# Patient Record
Sex: Female | Born: 1965 | Hispanic: No | Marital: Married | Smoking: Never smoker
Health system: Southern US, Community
[De-identification: ages and names within clinical notes are randomized; demographics above are authoritative.]

## PROBLEM LIST (undated history)

## (undated) DIAGNOSIS — M1990 Unspecified osteoarthritis, unspecified site: Secondary | ICD-10-CM

## (undated) HISTORY — PX: APPENDECTOMY: SHX54

## (undated) HISTORY — PX: FOOT MASS EXCISION: SHX1663

## (undated) HISTORY — PX: DIAGNOSTIC LAPAROSCOPY WITH REMOVAL OF ECTOPIC PREGNANCY: SHX6449

---

## 2004-01-31 ENCOUNTER — Other Ambulatory Visit: Admission: RE | Admit: 2004-01-31 | Discharge: 2004-01-31 | Payer: Self-pay | Admitting: Obstetrics and Gynecology

## 2005-07-01 ENCOUNTER — Other Ambulatory Visit: Admission: RE | Admit: 2005-07-01 | Discharge: 2005-07-01 | Payer: Self-pay | Admitting: Obstetrics and Gynecology

## 2009-06-23 ENCOUNTER — Encounter: Admission: RE | Admit: 2009-06-23 | Discharge: 2009-06-23 | Payer: Self-pay | Admitting: Obstetrics and Gynecology

## 2014-05-27 ENCOUNTER — Other Ambulatory Visit: Payer: Self-pay | Admitting: Obstetrics and Gynecology

## 2014-05-27 DIAGNOSIS — N644 Mastodynia: Secondary | ICD-10-CM

## 2014-05-27 DIAGNOSIS — N6320 Unspecified lump in the left breast, unspecified quadrant: Secondary | ICD-10-CM

## 2014-06-09 ENCOUNTER — Ambulatory Visit
Admission: RE | Admit: 2014-06-09 | Discharge: 2014-06-09 | Disposition: A | Discharge status: Home / Self Care | Payer: BC Managed Care – PPO | Source: Ambulatory Visit | Attending: Obstetrics and Gynecology | Admitting: Obstetrics and Gynecology

## 2014-06-09 ENCOUNTER — Other Ambulatory Visit: Payer: Self-pay | Admitting: Obstetrics and Gynecology

## 2014-06-09 DIAGNOSIS — N6320 Unspecified lump in the left breast, unspecified quadrant: Secondary | ICD-10-CM

## 2014-06-09 DIAGNOSIS — N644 Mastodynia: Secondary | ICD-10-CM

## 2015-03-14 ENCOUNTER — Other Ambulatory Visit (HOSPITAL_BASED_OUTPATIENT_CLINIC_OR_DEPARTMENT_OTHER): Payer: Self-pay | Admitting: Family Medicine

## 2015-03-14 DIAGNOSIS — R51 Headache: Principal | ICD-10-CM

## 2015-03-14 DIAGNOSIS — R519 Headache, unspecified: Secondary | ICD-10-CM

## 2015-03-17 ENCOUNTER — Other Ambulatory Visit (HOSPITAL_BASED_OUTPATIENT_CLINIC_OR_DEPARTMENT_OTHER): Payer: Self-pay | Admitting: Family Medicine

## 2015-03-17 DIAGNOSIS — H539 Unspecified visual disturbance: Secondary | ICD-10-CM

## 2015-03-18 ENCOUNTER — Ambulatory Visit (HOSPITAL_BASED_OUTPATIENT_CLINIC_OR_DEPARTMENT_OTHER)
Admission: RE | Admit: 2015-03-18 | Discharge: 2015-03-18 | Disposition: A | Discharge status: Home / Self Care | Payer: BLUE CROSS/BLUE SHIELD | Source: Ambulatory Visit | Attending: Family Medicine | Admitting: Family Medicine

## 2015-03-18 DIAGNOSIS — H43399 Other vitreous opacities, unspecified eye: Secondary | ICD-10-CM | POA: Insufficient documentation

## 2015-03-18 DIAGNOSIS — H539 Unspecified visual disturbance: Secondary | ICD-10-CM

## 2015-03-18 DIAGNOSIS — R51 Headache: Secondary | ICD-10-CM | POA: Insufficient documentation

## 2015-03-21 ENCOUNTER — Ambulatory Visit (INDEPENDENT_AMBULATORY_CARE_PROVIDER_SITE_OTHER): Payer: BLUE CROSS/BLUE SHIELD

## 2015-03-21 ENCOUNTER — Ambulatory Visit (INDEPENDENT_AMBULATORY_CARE_PROVIDER_SITE_OTHER): Payer: BLUE CROSS/BLUE SHIELD | Admitting: Podiatry

## 2015-03-21 VITALS — BP 116/71 | HR 69 | Resp 16 | Ht 67.0 in | Wt 200.0 lb

## 2015-03-21 DIAGNOSIS — M2041 Other hammer toe(s) (acquired), right foot: Secondary | ICD-10-CM | POA: Diagnosis not present

## 2015-03-21 NOTE — Progress Notes (Signed)
   Subjective:    Patient ID: Shannon Johnston, female    DOB: [birth year], 49 y.o.   MRN: [id number]  HPI: She presents today after having developed some numbness and tingling in her second digit of her right foot. States that 2 years ago she jammed the toe twice within 2 days which left it painful and tender at times. She states that sometimes the numbness extends over to the lateral side of the hallux right.    Review of Systems  All other systems reviewed and are negative.      Objective:   Physical Exam: 49 year old white female in no apparent distress vital signs stable alert and oriented 3. Pulses are strongly palpable. Neurologic sensorium is intact per Semmes-Weinstein monofilament. Deep tendon reflexes are intact bilateral and muscle strength +5 over 5 dorsiflexion and plantar flexors and inverters and everters all intrinsic musculature is intact. Orthopedic evaluation does demonstrate hammertoe deformities 2 through 5 bilaterally right side seems to be worse with more rigid hammertoe deformity at the second PIPJ right foot. She also demonstrates a sagittal bone deformity dorsal aspect of the bilateral foot with some tenderness on palpation of the deep peroneal nerve in this region right greater than left noted deformity appears to be worse on the left side. Radiographic evaluation 3 views bilateral foot today in the office demonstrates hammertoe deformity second right with osteoarthritic changes at the PIPJ. Contracture of the second metatarsophalangeal joint is also noted. Cutaneous evaluation demonstrates reactive hyperkeratosis subsecond metatarsophalangeal joints bilateral.        Assessment & Plan:  Assessment: Hammertoe deformities bilateral. Second digit right foot appears to be more arthritic. She also has some neuritis of the deep peroneal nerve due to several bone deformity bilateral right greater than left.  Plan: Discussed etiology and pathology conservative versus  surgical therapy she will follow-up with me for surgical discussion as her symptoms worsen.  Arbutus Ped DPM

## 2018-05-27 DIAGNOSIS — M19041 Primary osteoarthritis, right hand: Secondary | ICD-10-CM | POA: Insufficient documentation

## 2018-05-27 DIAGNOSIS — IMO0002 Reserved for concepts with insufficient information to code with codable children: Secondary | ICD-10-CM | POA: Insufficient documentation

## 2018-05-27 DIAGNOSIS — R229 Localized swelling, mass and lump, unspecified: Secondary | ICD-10-CM

## 2018-05-27 DIAGNOSIS — M6740 Ganglion, unspecified site: Secondary | ICD-10-CM | POA: Insufficient documentation

## 2018-06-02 ENCOUNTER — Other Ambulatory Visit: Payer: Self-pay | Admitting: Orthopedic Surgery

## 2018-06-16 ENCOUNTER — Other Ambulatory Visit: Payer: Self-pay | Admitting: Podiatry

## 2018-06-16 ENCOUNTER — Encounter: Payer: Self-pay | Admitting: Podiatry

## 2018-06-16 ENCOUNTER — Telehealth: Payer: Self-pay | Admitting: Podiatry

## 2018-06-16 ENCOUNTER — Ambulatory Visit (INDEPENDENT_AMBULATORY_CARE_PROVIDER_SITE_OTHER): Payer: BLUE CROSS/BLUE SHIELD

## 2018-06-16 ENCOUNTER — Ambulatory Visit (INDEPENDENT_AMBULATORY_CARE_PROVIDER_SITE_OTHER): Payer: BLUE CROSS/BLUE SHIELD | Admitting: Podiatry

## 2018-06-16 DIAGNOSIS — M2041 Other hammer toe(s) (acquired), right foot: Secondary | ICD-10-CM

## 2018-06-16 DIAGNOSIS — M779 Enthesopathy, unspecified: Secondary | ICD-10-CM

## 2018-06-16 DIAGNOSIS — M778 Other enthesopathies, not elsewhere classified: Secondary | ICD-10-CM

## 2018-06-16 DIAGNOSIS — G5762 Lesion of plantar nerve, left lower limb: Secondary | ICD-10-CM

## 2018-06-16 DIAGNOSIS — N951 Menopausal and female climacteric states: Secondary | ICD-10-CM | POA: Insufficient documentation

## 2018-06-16 DIAGNOSIS — N63 Unspecified lump in unspecified breast: Secondary | ICD-10-CM | POA: Insufficient documentation

## 2018-06-16 NOTE — Telephone Encounter (Signed)
I was given a boot this morning and I think its for the left foot. If somebody could call me back and let me know at [phone].

## 2018-06-16 NOTE — Telephone Encounter (Signed)
I called pt and informed that the boot could be used for either foot.

## 2018-06-16 NOTE — Progress Notes (Signed)
She presents today chief complaint of pain to the second metatarsal phalangeal joint of the right foot as well as pain to the second metatarsophalangeal joint of the left foot.  She states that she would like to have the right foot surgically corrected because becoming more more painful as time goes on and is just exquisitely painful with shoe gear.  Objective: Vital signs are stable she is alert and oriented x3 I reviewed her past medical history medications allergy surgeries and social history.  Radiographs taken today demonstrates osteoarthritic change of the PIPJ second digit of the right foot with corrective hammertoe deformity second metatarsophalangeal joint and third metatarsal phalangeal joint which appears to be more flexible in nature right foot.  Left foot demonstrates painful elongated plantarflexed second metatarsal with mild hammertoe deformity.  None reducible hammertoe deformity second PIPJ right foot.  Reducible metatarsal phalangeal joint second and third.  Right foot painful on palpation and range of motion of the second metatarsal phalangeal joint.  Assessment: Capsulitis second metatarsal phalangeal joint left foot.  Hammertoe deformity with plantarflexed second and third metatarsal second and third toes right foot.  Plan: Discussed etiology pathology and surgical therapies this point time went ahead and injected the left second metatarsal phalangeal joint today with 5 mg of Kenalog 5 mg Marcaine point maximal tenderness left foot.  Right foot we consented her today for surgical intervention consisting of second metatarsal osteotomy hammertoe repair with screws #2 #3 of the right foot.  We discussed pros and cons of the surgery as well as possible complications which may include but are not limited to postop pain bleeding swelling flexion recurrence need further surgery overcorrection under correction digit loss of digit loss of limb loss of life.  We dispensed both oral and written  home-going instructions for her today regarding the day of surgery.  Also provided her with her Cam walker today.  Follow-up with her in 1 month or thereabouts for surgery.

## 2018-06-16 NOTE — Patient Instructions (Signed)
Pre-Operative Instructions  Congratulations, you have decided to take an important step towards improving your quality of life.  You can be assured that the doctors and staff at [facility] will be with you every step of the way.  Here are some important things you should know:  1. Plan to be at the surgery center/hospital at least 1 (one) hour prior to your scheduled time, unless otherwise directed by the surgical center/hospital staff.  You must have a responsible adult accompany you, remain during the surgery and drive you home.  Make sure you have directions to the surgical center/hospital to ensure you arrive on time. 2. If you are having surgery at [facility] or  hospitals, you will need a copy of your medical history and physical form from your family physician within one month prior to the date of surgery. We will give you a form for your primary physician to complete.  3. We make every effort to accommodate the date you request for surgery.  However, there are times where surgery dates or times have to be moved.  We will contact you as soon as possible if a change in schedule is required.   4. No aspirin/ibuprofen for one week before surgery.  If you are on aspirin, any non-steroidal anti-inflammatory medications (Mobic, Aleve, Ibuprofen) should not be taken seven (7) days prior to your surgery.  You make take Tylenol for pain prior to surgery.  5. Medications - If you are taking daily heart and blood pressure medications, seizure, reflux, allergy, asthma, anxiety, pain or diabetes medications, make sure you notify the surgery center/hospital before the day of surgery so they can tell you which medications you should take or avoid the day of surgery. 6. No food or drink after midnight the night before surgery unless directed otherwise by surgical center/hospital staff. 7. No alcoholic beverages 24-hours prior to surgery.  No smoking 24-hours prior or 24-hours after  surgery. 8. Wear loose pants or shorts. They should be loose enough to fit over bandages, boots, and casts. 9. Don't wear slip-on shoes. Sneakers are preferred. 10. Bring your boot with you to the surgery center/hospital.  Also bring crutches or a walker if your physician has prescribed it for you.  If you do not have this equipment, it will be provided for you after surgery. 11. If you have not been contacted by the surgery center/hospital by the day before your surgery, call to confirm the date and time of your surgery. 12. Leave-time from work may vary depending on the type of surgery you have.  Appropriate arrangements should be made prior to surgery with your employer. 13. Prescriptions will be provided immediately following surgery by your doctor.  Fill these as soon as possible after surgery and take the medication as directed. Pain medications will not be refilled on weekends and must be approved by the doctor. 14. Remove nail polish on the operative foot and avoid getting pedicures prior to surgery. 15. Wash the night before surgery.  The night before surgery wash the foot and leg well with water and the antibacterial soap provided. Be sure to pay special attention to beneath the toenails and in between the toes.  Wash for at least three (3) minutes. Rinse thoroughly with water and dry well with a towel.  Perform this wash unless told not to do so by your physician.  Enclosed: 1 Ice pack (please put in freezer the night before surgery)   1 Hibiclens skin cleaner     Pre-op instructions  If you have any questions regarding the instructions, please do not hesitate to call our office.  [place]: [address], [place], [place] [address] -- [phone]  [place]: [address] [place], [place] [address] -- [phone]  [place]: [address]  [place], [place] [address] -- [phone]  [place]: [address], [address], [place], [place] [address] -- [phone]  Website:  [email or web address] 

## 2018-06-19 ENCOUNTER — Telehealth: Payer: Self-pay | Admitting: *Deleted

## 2018-06-19 NOTE — Telephone Encounter (Signed)
"  I'd like to schedule my surgery with Dr. Al Corpus."  Dr. Al Corpus does surgeries on Fridays.  Do you have a date in mind that you would like?  "Yes, I'd like to do it the last week of February."  Dr. Al Corpus is not doing surgeries on February 28.  He can do it on February 21.  "Put me down for February 21.  Is there anything else I need to do?"  You need to register with the surgical center, instructions on how to do that is in the brochure that we gave you.  Someone from the surgical center will call you a day or two prior to your surgical date.  They will give you your arrival time.

## 2018-06-24 ENCOUNTER — Telehealth: Payer: Self-pay | Admitting: *Deleted

## 2018-06-24 ENCOUNTER — Other Ambulatory Visit: Payer: Self-pay

## 2018-06-24 ENCOUNTER — Encounter (HOSPITAL_BASED_OUTPATIENT_CLINIC_OR_DEPARTMENT_OTHER): Payer: Self-pay | Admitting: *Deleted

## 2018-06-24 NOTE — Telephone Encounter (Signed)
"  I'm calling to cancel my surgery for February 2l.  Is it too soon to schedule for surgery the first week of November?  The reason I'm asking is because my insurance just started over and I have a high deductible.  By November, I should have met my deductible."  I will get it rescheduled.  Dr. Milinda Pointer can do it on November 6.  "Okay, what time?"  Someone from the surgical center will call you a day or two prior to your surgery date and they will give you your arrival time.  You will need to see Dr. Milinda Pointer prior to that date for another consult to sign consent forms again.  I can transfer you to a scheduler so you can make that appointment if you like.  "Yes, that would be great."  I transferred the call to [place].  I changed the date in the [facility] [employer] Portal.

## 2018-07-02 ENCOUNTER — Ambulatory Visit (HOSPITAL_BASED_OUTPATIENT_CLINIC_OR_DEPARTMENT_OTHER)
Admission: RE | Admit: 2018-07-02 | Discharge: 2018-07-02 | Disposition: A | Discharge status: Home / Self Care | Payer: BLUE CROSS/BLUE SHIELD | Source: Ambulatory Visit | Attending: Orthopedic Surgery | Admitting: Orthopedic Surgery

## 2018-07-02 ENCOUNTER — Encounter (HOSPITAL_BASED_OUTPATIENT_CLINIC_OR_DEPARTMENT_OTHER): Payer: Self-pay

## 2018-07-02 ENCOUNTER — Encounter (HOSPITAL_BASED_OUTPATIENT_CLINIC_OR_DEPARTMENT_OTHER)
Admission: RE | Disposition: A | Discharge status: Home / Self Care | Payer: Self-pay | Source: Ambulatory Visit | Attending: Orthopedic Surgery

## 2018-07-02 ENCOUNTER — Ambulatory Visit (HOSPITAL_BASED_OUTPATIENT_CLINIC_OR_DEPARTMENT_OTHER): Payer: BLUE CROSS/BLUE SHIELD | Admitting: Certified Registered"

## 2018-07-02 ENCOUNTER — Other Ambulatory Visit: Payer: Self-pay

## 2018-07-02 DIAGNOSIS — M19041 Primary osteoarthritis, right hand: Secondary | ICD-10-CM | POA: Insufficient documentation

## 2018-07-02 DIAGNOSIS — Z8261 Family history of arthritis: Secondary | ICD-10-CM | POA: Diagnosis not present

## 2018-07-02 DIAGNOSIS — M71341 Other bursal cyst, right hand: Secondary | ICD-10-CM | POA: Diagnosis not present

## 2018-07-02 DIAGNOSIS — M67441 Ganglion, right hand: Secondary | ICD-10-CM | POA: Diagnosis present

## 2018-07-02 DIAGNOSIS — M25741 Osteophyte, right hand: Secondary | ICD-10-CM | POA: Insufficient documentation

## 2018-07-02 HISTORY — DX: Unspecified osteoarthritis, unspecified site: M19.90

## 2018-07-02 HISTORY — PX: MASS EXCISION: SHX2000

## 2018-07-02 SURGERY — EXCISION MASS
Anesthesia: Regional | Site: Hand | Laterality: Right

## 2018-07-02 MED ORDER — BUPIVACAINE HCL (PF) 0.25 % IJ SOLN
INTRAMUSCULAR | Status: DC | PRN
  Administered 2018-07-02: 7 mL

## 2018-07-02 MED ORDER — CEFAZOLIN SODIUM-DEXTROSE 2-4 GM/100ML-% IV SOLN
INTRAVENOUS | Status: AC
  Filled 2018-07-02: qty 100

## 2018-07-02 MED ORDER — HYDROMORPHONE HCL 1 MG/ML IJ SOLN: 0.2500 mg | INTRAMUSCULAR | Status: DC | PRN

## 2018-07-02 MED ORDER — MIDAZOLAM HCL 2 MG/2ML IJ SOLN
1.0000 mg | INTRAMUSCULAR | Status: DC | PRN
  Administered 2018-07-02: 2 mg via INTRAVENOUS

## 2018-07-02 MED ORDER — TRAMADOL HCL 50 MG PO TABS: 50.0000 mg | ORAL_TABLET | Freq: Four times a day (QID) | ORAL | 0 refills | Status: AC | PRN

## 2018-07-02 MED ORDER — CHLORHEXIDINE GLUCONATE 4 % EX LIQD: 60.0000 mL | Freq: Once | CUTANEOUS | Status: DC

## 2018-07-02 MED ORDER — CEFAZOLIN SODIUM-DEXTROSE 2-4 GM/100ML-% IV SOLN
2.0000 g | INTRAVENOUS | Status: AC
  Administered 2018-07-02: 2 g via INTRAVENOUS

## 2018-07-02 MED ORDER — PROMETHAZINE HCL 25 MG/ML IJ SOLN: 6.2500 mg | INTRAMUSCULAR | Status: DC | PRN

## 2018-07-02 MED ORDER — OXYCODONE HCL 5 MG/5ML PO SOLN: 5.0000 mg | Freq: Once | ORAL | Status: DC | PRN

## 2018-07-02 MED ORDER — OXYCODONE HCL 5 MG PO TABS: 5.0000 mg | ORAL_TABLET | Freq: Once | ORAL | Status: DC | PRN

## 2018-07-02 MED ORDER — FENTANYL CITRATE (PF) 100 MCG/2ML IJ SOLN
INTRAMUSCULAR | Status: AC
  Filled 2018-07-02: qty 2

## 2018-07-02 MED ORDER — PROPOFOL 500 MG/50ML IV EMUL
INTRAVENOUS | Status: DC | PRN
  Administered 2018-07-02: 50 ug/kg/min via INTRAVENOUS

## 2018-07-02 MED ORDER — SCOPOLAMINE 1 MG/3DAYS TD PT72: 1.0000 | MEDICATED_PATCH | Freq: Once | TRANSDERMAL | Status: DC | PRN

## 2018-07-02 MED ORDER — MIDAZOLAM HCL 2 MG/2ML IJ SOLN
INTRAMUSCULAR | Status: AC
  Filled 2018-07-02: qty 2

## 2018-07-02 MED ORDER — ONDANSETRON HCL 4 MG/2ML IJ SOLN
INTRAMUSCULAR | Status: DC | PRN
  Administered 2018-07-02: 4 mg via INTRAVENOUS

## 2018-07-02 MED ORDER — LACTATED RINGERS IV SOLN
INTRAVENOUS | Status: DC
  Administered 2018-07-02: 10:00:00 via INTRAVENOUS

## 2018-07-02 MED ORDER — FENTANYL CITRATE (PF) 100 MCG/2ML IJ SOLN
50.0000 ug | INTRAMUSCULAR | Status: DC | PRN
  Administered 2018-07-02: 50 ug via INTRAVENOUS

## 2018-07-02 MED ORDER — LIDOCAINE HCL (CARDIAC) PF 100 MG/5ML IV SOSY
PREFILLED_SYRINGE | INTRAVENOUS | Status: DC | PRN
  Administered 2018-07-02: 30 mg via INTRAVENOUS

## 2018-07-02 SURGICAL SUPPLY — 44 items
BANDAGE COBAN STERILE 2 (GAUZE/BANDAGES/DRESSINGS) IMPLANT
BLADE SURG 15 STRL LF DISP TIS (BLADE) ×1 IMPLANT
BLADE SURG 15 STRL SS (BLADE) ×1
BNDG COHESIVE 1X5 TAN STRL LF (GAUZE/BANDAGES/DRESSINGS) ×2 IMPLANT
BNDG COHESIVE 3X5 TAN STRL LF (GAUZE/BANDAGES/DRESSINGS) IMPLANT
BNDG ESMARK 4X9 LF (GAUZE/BANDAGES/DRESSINGS) IMPLANT
BNDG GAUZE ELAST 4 BULKY (GAUZE/BANDAGES/DRESSINGS) IMPLANT
CHLORAPREP W/TINT 26ML (MISCELLANEOUS) ×2 IMPLANT
CORD BIPOLAR FORCEPS 12FT (ELECTRODE) ×2 IMPLANT
COVER BACK TABLE 60X90IN (DRAPES) ×2 IMPLANT
COVER MAYO STAND STRL (DRAPES) ×2 IMPLANT
COVER WAND RF STERILE (DRAPES) IMPLANT
CUFF TOURNIQUET SINGLE 18IN (TOURNIQUET CUFF) IMPLANT
DECANTER SPIKE VIAL GLASS SM (MISCELLANEOUS) IMPLANT
DRAIN PENROSE 1/2X12 LTX STRL (WOUND CARE) IMPLANT
DRAPE EXTREMITY T 121X128X90 (DISPOSABLE) ×2 IMPLANT
DRAPE SURG 17X23 STRL (DRAPES) ×2 IMPLANT
GAUZE SPONGE 4X4 12PLY STRL (GAUZE/BANDAGES/DRESSINGS) ×2 IMPLANT
GAUZE XEROFORM 1X8 LF (GAUZE/BANDAGES/DRESSINGS) ×2 IMPLANT
GLOVE BIO SURGEON STRL SZ 6.5 (GLOVE) ×4 IMPLANT
GLOVE BIOGEL PI IND STRL 7.0 (GLOVE) ×2 IMPLANT
GLOVE BIOGEL PI IND STRL 8.5 (GLOVE) ×1 IMPLANT
GLOVE BIOGEL PI INDICATOR 7.0 (GLOVE) ×2
GLOVE BIOGEL PI INDICATOR 8.5 (GLOVE) ×1
GLOVE SURG ORTHO 8.0 STRL STRW (GLOVE) ×2 IMPLANT
GOWN STRL REUS W/ TWL LRG LVL3 (GOWN DISPOSABLE) ×1 IMPLANT
GOWN STRL REUS W/TWL LRG LVL3 (GOWN DISPOSABLE) ×1
GOWN STRL REUS W/TWL XL LVL3 (GOWN DISPOSABLE) ×2 IMPLANT
NDL SAFETY ECLIPSE 18X1.5 (NEEDLE) IMPLANT
NEEDLE HYPO 18GX1.5 SHARP (NEEDLE)
NEEDLE PRECISIONGLIDE 27X1.5 (NEEDLE) ×2 IMPLANT
NS IRRIG 1000ML POUR BTL (IV SOLUTION) ×2 IMPLANT
PACK BASIN DAY SURGERY FS (CUSTOM PROCEDURE TRAY) ×2 IMPLANT
PAD CAST 3X4 CTTN HI CHSV (CAST SUPPLIES) IMPLANT
PADDING CAST COTTON 3X4 STRL (CAST SUPPLIES)
SPLINT PLASTER CAST XFAST 3X15 (CAST SUPPLIES) IMPLANT
SPLINT PLASTER XTRA FASTSET 3X (CAST SUPPLIES)
STOCKINETTE 4X48 STRL (DRAPES) ×2 IMPLANT
SUT ETHILON 4 0 PS 2 18 (SUTURE) ×2 IMPLANT
SUT VIC AB 4-0 P2 18 (SUTURE) IMPLANT
SYR BULB 3OZ (MISCELLANEOUS) ×2 IMPLANT
SYR CONTROL 10ML LL (SYRINGE) ×2 IMPLANT
TOWEL GREEN STERILE FF (TOWEL DISPOSABLE) ×2 IMPLANT
UNDERPAD 30X30 (UNDERPADS AND DIAPERS) ×2 IMPLANT

## 2018-07-02 NOTE — Discharge Instructions (Addendum)

## 2018-07-02 NOTE — Anesthesia Procedure Notes (Signed)
Anesthesia Regional Block: Bier block (IV Regional)   Pre-Anesthetic Checklist: ,, timeout performed, Correct Patient, Correct Site, Correct Laterality, Correct Procedure,, site marked, surgical consent,, at surgeon's request  Laterality: Right     Needles:  Injection technique: Single-shot  Needle Type: Other      Needle Gauge: 20     Additional Needles:   Procedures:,,,,, intact distal pulses, Esmarch exsanguination, single tourniquet utilized,  Narrative:   Performed by: Personally       

## 2018-07-02 NOTE — Anesthesia Procedure Notes (Signed)
Procedure Name: MAC Date/Time: 07/02/2018 10:54 AM Performed by: Signe Colt, CRNA Pre-anesthesia Checklist: Patient identified, Emergency Drugs available, Suction available, Patient being monitored and Timeout performed Patient Re-evaluated:Patient Re-evaluated prior to induction Oxygen Delivery Method: Simple face mask

## 2018-07-02 NOTE — H&P (Signed)
  Shannon Johnston is an 53 y.o. female.   Chief Complaint: mass right thumb WIO:XBDZ is a 53 year old right-hand-dominant female referred by Dr. Abigail Miyamoto for consultation regarding a mass on the IP joint of her right thumb. She states is been present for 2 months. She recalls no history of injury. Because occasional aching pain going up her thumb with a VAS score 5/10. She has not had any treatment nor tried anything for this. She has a history of arthritis no history of diabetes thyroid problems or gout. Family history is positive for arthritis diabetes negative for thyroid problems and gout. She has been tested for diabetes. She states there is no particular activity that seems to make it better or worse. She states that it is enlarged and got smaller.   Past Medical History:  Diagnosis Date  . Arthritis       No family history on file. Social History:  reports that she has never smoked. She has never used smokeless tobacco. She reports current alcohol use. She reports that she does not use drugs.  Allergies: No Known Allergies  No medications prior to admission.    No results found for this or any previous visit (from the past 48 hour(s)).  No results found.   Pertinent items are noted in HPI.  Height 5\' 7"  (1.702 m), weight 100.7 kg.  General appearance: alert, cooperative and appears stated age Head: Normocephalic, without obvious abnormality Neck: no JVD Resp: clear to auscultation bilaterally Cardio: regular rate and rhythm, S1, S2 normal, no murmur, click, rub or gallop GI: soft, non-tender; bowel sounds normal; no masses,  no organomegaly Extremities: mass right thumb Pulses: 2+ and symmetric Skin: Skin color, texture, turgor normal. No rashes or lesions Neurologic: Grossly normal Incision/Wound: na  Assessment/Plan Assessment:   Osteoarthritis of finger of right hand   Mucoid cyst, joint    Plan: Have discussed the etiology of the problem with her. She would  like to have this removed. Pre-peri-and postoperative course been discussed along with risks and complications. She is aware that there is no guarantee to the surgery the possibility of infection recurrence injury to arteries nerves tendons complete relief symptoms disc possibility of recurrence. She would like to proceed is scheduled for excision mucoid cyst debridement inner phalangeal joint right thumb as an outpatient under regional anesthesia.    Shannon Johnston 07/02/2018, 9:46 AM

## 2018-07-02 NOTE — Transfer of Care (Signed)
Immediate Anesthesia Transfer of Care Note  Patient: Shannon Johnston  Procedure(s) Performed: EXCISION CYST RIGHT THUMB, DEBRIDEMENT INTERPHALANGEAL RIGHT THUMB (Right Hand)  Patient Location: PACU  Anesthesia Type:Bier block  Level of Consciousness: awake, alert , oriented and patient cooperative  Airway & Oxygen Therapy: Patient Spontanous Breathing and Patient connected to face mask oxygen  Post-op Assessment: Report given to RN and Post -op Vital signs reviewed and stable  Post vital signs: Reviewed and stable  Last Vitals:  Vitals Value Taken Time  BP    Temp    Pulse    Resp    SpO2      Last Pain:  Vitals:   07/02/18 1016  TempSrc: Oral  PainSc:          Complications: No apparent anesthesia complications

## 2018-07-02 NOTE — Op Note (Signed)
NAME: Shannon Johnston MEDICAL RECORD NO: [id number] DATE OF BIRTH: 05-08-1966 FACILITY: [facility] LOCATION: [facility] PHYSICIAN: Nicki Reaper, MD   OPERATIVE REPORT   DATE OF PROCEDURE: 07/02/18    PREOPERATIVE DIAGNOSIS:   Mucoid tumor IP joint right thumb   POSTOPERATIVE DIAGNOSIS:   Same   PROCEDURE:  Surgery mucoid cyst with debridement interphalangeal joint osteophytes proximal phalanx right thumb   SURGEON: Cindee Salt, M.D.   ASSISTANT: none   ANESTHESIA:  Bier block with sedation and Local   INTRAVENOUS FLUIDS:  Per anesthesia flow sheet.   ESTIMATED BLOOD LOSS:  Minimal.   COMPLICATIONS:  None.   SPECIMENS:   Cyst synovial tissue osteophyte   TOURNIQUET TIME:    Total Tourniquet Time Documented: Forearm (Right) - 21 minutes Total: Forearm (Right) - 21 minutes    DISPOSITION:  Stable to PACU.   INDICATIONS: Patient is a 53 year old female with a large mass on the dorsal aspect of the IP joint of her right thumb.  This transilluminates.  She is desirous having this excised with debridement of the joint showing degenerative arthritis on x-ray.  Pre-peri-and postoperative course been discussed along with risk complications.  She is aware that there is no guarantee to the surgery the possibility of infection recurrence injury to arteries nerves tendons complete relief symptoms and dystrophy.  In the preoperative area the patient is seen extremity marked by both patient and surgeon antibiotic given  OPERATIVE COURSE: Patient is brought to the operating room where a forearm-based IV regional anesthetic was carried out without difficulty.  She was prepped and draped supine position with the right arm free.  Prep was done with ChloraPrep and a three-minute dry time was allowed timeout taken confirming patient procedure.  A metacarpal block was given quarter percent bupivacaine without epinephrine 7 cc was used.  A curvilinear incision was made over the inner  phalangeal joint the mass of the right thumb carried down through subcutaneous tissue.  The mass was immediately encountered.  With blunt sharp dissection this was dissected free and sent to pathology.  The joint was opened lateral to the extensor tendon.  Moderate swelling of the joint was present.  A hemostatic rondure and house curette were used to remove the osteophytes from the proximal phalanx and a synovectomy performed.  The specimen was sent to pathology.  The wound was copiously irrigated with saline.  The skin was closed and opted for nylon sutures.  Sterile compressive dressing and splint to the finger was applied.  Inflation of the tourniquet all fingers immediately pink.  She was taken to the recovery room for observation in satisfactory condition.  She will be discharged home to return to the hand center [facility] on Tylenol ibuprofen for pain with Ultram as a backup.   Cindee Salt, MD Electronically signed, 07/02/18

## 2018-07-02 NOTE — Brief Op Note (Signed)
07/02/2018  11:13 AM  PATIENT:  Shannon Johnston  53 y.o. female  PRE-OPERATIVE DIAGNOSIS:  MUCOID CYST RIGHT THUMB, DEGENERATIVE JOINT DISEASE INTERPHALANGEAL JOINT RIGHT THUMB  POST-OPERATIVE DIAGNOSIS:  MUCOID CYST RIGHT THUMB, DEGENERATIVE JOINT   PROCEDURE:  Procedure(s): EXCISION CYST RIGHT THUMB, DEBRIDEMENT INTERPHALANGEAL RIGHT THUMB (Right)  SURGEON:  Surgeon(s) and Role:    * Cindee Salt, MD - Primary  PHYSICIAN ASSISTANT:   ASSISTANTS: none   ANESTHESIA:   local, regional and IV sedation  EBL:  16ml  BLOOD ADMINISTERED:none  DRAINS: none   LOCAL MEDICATIONS USED:  BUPIVICAINE   SPECIMEN:  Excision  DISPOSITION OF SPECIMEN:  PATHOLOGY  COUNTS:  YES  TOURNIQUET:   Total Tourniquet Time Documented: Forearm (Right) - 21 minutes Total: Forearm (Right) - 21 minutes   DICTATION: .Dragon Dictation  PLAN OF CARE: Discharge to home after PACU  PATIENT DISPOSITION:  PACU - hemodynamically stable.

## 2018-07-02 NOTE — Anesthesia Postprocedure Evaluation (Signed)
Anesthesia Post Note  Patient: Shannon Johnston  Procedure(s) Performed: EXCISION CYST RIGHT THUMB, DEBRIDEMENT INTERPHALANGEAL RIGHT THUMB (Right Hand)     Patient location during evaluation: PACU Anesthesia Type: Bier Block Level of consciousness: awake and alert Pain management: pain level controlled Vital Signs Assessment: post-procedure vital signs reviewed and stable Respiratory status: spontaneous breathing, nonlabored ventilation and respiratory function stable Cardiovascular status: blood pressure returned to baseline and stable Postop Assessment: no apparent nausea or vomiting Anesthetic complications: no    Last Vitals:  Vitals:   07/02/18 1130 07/02/18 1215  BP: 136/89 120/62  Pulse: (!) 58 (!) 52  Resp: 15   Temp:  36.4 C  SpO2: 98% 100%    Last Pain:  Vitals:   07/02/18 1215  TempSrc: Oral  PainSc: 0-No pain                 Lowella Curb

## 2018-07-02 NOTE — Anesthesia Preprocedure Evaluation (Signed)
Anesthesia Evaluation  Patient identified by MRN, date of birth, ID band Patient awake    Reviewed: Allergy & Precautions, NPO status , Patient's Chart, lab work & pertinent test results  Airway Mallampati: II  TM Distance: >3 FB Neck ROM: Full    Dental no notable dental hx.    Pulmonary neg pulmonary ROS,    Pulmonary exam normal breath sounds clear to auscultation       Cardiovascular negative cardio ROS Normal cardiovascular exam Rhythm:Regular Rate:Normal     Neuro/Psych negative neurological ROS  negative psych ROS   GI/Hepatic negative GI ROS, Neg liver ROS,   Endo/Other  negative endocrine ROS  Renal/GU negative Renal ROS  negative genitourinary   Musculoskeletal  (+) Arthritis , Osteoarthritis,    Abdominal (+) + obese,   Peds negative pediatric ROS (+)  Hematology negative hematology ROS (+)   Anesthesia Other Findings   Reproductive/Obstetrics negative OB ROS                             Anesthesia Physical Anesthesia Plan  ASA: II  Anesthesia Plan: Bier Block and Bier Block-LIDOCAINE ONLY   Post-op Pain Management:    Induction: Intravenous  PONV Risk Score and Plan: 2 and Ondansetron and Midazolam  Airway Management Planned: Simple Face Mask  Additional Equipment:   Intra-op Plan:   Post-operative Plan:   Informed Consent: I have reviewed the patients History and Physical, chart, labs and discussed the procedure including the risks, benefits and alternatives for the proposed anesthesia with the patient or authorized representative who has indicated his/her understanding and acceptance.     Dental advisory given  Plan Discussed with: CRNA  Anesthesia Plan Comments:         Anesthesia Quick Evaluation

## 2018-07-03 ENCOUNTER — Encounter (HOSPITAL_BASED_OUTPATIENT_CLINIC_OR_DEPARTMENT_OTHER): Payer: Self-pay | Admitting: Orthopedic Surgery

## 2018-10-06 ENCOUNTER — Ambulatory Visit (INDEPENDENT_AMBULATORY_CARE_PROVIDER_SITE_OTHER): Payer: BLUE CROSS/BLUE SHIELD | Admitting: Podiatry

## 2018-10-06 ENCOUNTER — Encounter: Payer: Self-pay | Admitting: Podiatry

## 2018-10-06 ENCOUNTER — Other Ambulatory Visit: Payer: Self-pay

## 2018-10-06 VITALS — Temp 97.7°F

## 2018-10-06 DIAGNOSIS — M7752 Other enthesopathy of left foot: Secondary | ICD-10-CM

## 2018-10-06 DIAGNOSIS — M778 Other enthesopathies, not elsewhere classified: Secondary | ICD-10-CM

## 2018-10-06 DIAGNOSIS — M779 Enthesopathy, unspecified: Principal | ICD-10-CM

## 2018-10-06 NOTE — Progress Notes (Signed)
She presents today for follow-up of capsulitis second metatarsal phalangeal joint.  States that I think you need another cortisone shot.  Objective: Signs are stable alert and oriented x3.  She has mild tenderness on palpation dorsal lateral aspect of the second metatarsal phalangeal joint and plantar distal aspect of the second metatarsal phalangeal joint on the left foot.  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  Plan: Discussed etiology pathology and surgical therapies after sterile Betadine skin prep injected 20 mg Kenalog 5 mg Marcaine around the second metatarsal phalangeal joint dorsal medial and lateral.  I also reached the plantar aspect.  I will follow-up with her on an as-needed basis.

## 2018-12-01 ENCOUNTER — Other Ambulatory Visit: Payer: Self-pay

## 2018-12-01 ENCOUNTER — Ambulatory Visit (INDEPENDENT_AMBULATORY_CARE_PROVIDER_SITE_OTHER): Payer: BC Managed Care – PPO | Admitting: Podiatry

## 2018-12-01 ENCOUNTER — Encounter: Payer: Self-pay | Admitting: Podiatry

## 2018-12-01 ENCOUNTER — Telehealth: Payer: Self-pay | Admitting: *Deleted

## 2018-12-01 VITALS — Temp 97.3°F

## 2018-12-01 DIAGNOSIS — M779 Enthesopathy, unspecified: Secondary | ICD-10-CM | POA: Diagnosis not present

## 2018-12-01 DIAGNOSIS — M778 Other enthesopathies, not elsewhere classified: Secondary | ICD-10-CM

## 2018-12-01 DIAGNOSIS — M2041 Other hammer toe(s) (acquired), right foot: Secondary | ICD-10-CM

## 2018-12-01 MED ORDER — MELOXICAM 15 MG PO TABS: 15.0000 mg | ORAL_TABLET | Freq: Every day | ORAL | 3 refills | Status: DC

## 2018-12-01 MED ORDER — METHYLPREDNISOLONE 4 MG PO TBPK: ORAL_TABLET | ORAL | 0 refills | Status: DC

## 2018-12-01 NOTE — Telephone Encounter (Signed)
Orders to J. Quintana, RN for pre-cert, faxed to [facility]. 

## 2018-12-01 NOTE — Telephone Encounter (Signed)
-----   Message from Rip Harbour, [facility] sent at 12/01/2018 10:28 AM EDT ----- Regarding: MRI MRI left foot - evaluate capsulitis 2nd MPJ/pre-dislocation syndrome,possible tear left foot - surgical consideration

## 2018-12-02 ENCOUNTER — Encounter: Payer: Self-pay | Admitting: Podiatry

## 2018-12-02 NOTE — Progress Notes (Signed)
She presents today for follow-up of capsulitis on her left foot states that is just killing me abnormally warm 1 day with this.  I can barely stand to put weight on the foot.  I have a wedding coming up soon for my daughter it may be pushed out a little bit may be able to have surgery before the wedding.  Objective: Vital signs are stable alert and oriented x3.  Pulses are palpable.  Neurologic sensorium is intact.  She has moderate to severe pain on palpation of the second and third metatarsal phalangeal joints left.  Assessment: Chronic intractable pain second metatarsal third metatarsal of the left foot.  Plan: Started her on methylprednisolone to be followed by meloxicam.  Requesting an MRI of the left foot for surgical consideration most likely a tear of the capsule.

## 2018-12-31 ENCOUNTER — Other Ambulatory Visit: Payer: Self-pay

## 2018-12-31 ENCOUNTER — Encounter: Payer: Self-pay | Admitting: Podiatry

## 2018-12-31 ENCOUNTER — Ambulatory Visit (INDEPENDENT_AMBULATORY_CARE_PROVIDER_SITE_OTHER): Payer: BC Managed Care – PPO | Admitting: Podiatry

## 2018-12-31 VITALS — Temp 97.2°F

## 2018-12-31 DIAGNOSIS — M778 Other enthesopathies, not elsewhere classified: Secondary | ICD-10-CM

## 2018-12-31 DIAGNOSIS — M7752 Other enthesopathy of left foot: Secondary | ICD-10-CM

## 2018-12-31 DIAGNOSIS — M7751 Other enthesopathy of right foot: Secondary | ICD-10-CM | POA: Diagnosis not present

## 2018-12-31 NOTE — Progress Notes (Signed)
She presents today for follow-up of capsulitis or capsular tear to the second metatarsal phalangeal joint of the left foot.  She states that it still bothers me some I have not had the MRI done yet I does not want to go to the hospital on a little apprehensive.  My daughter's wedding has been delayed so I have time to have surgery.  Objective: Vital signs are stable she is alert and oriented x3.  Pulses are palpable.  Neurologic sensorium is intact deep tendon reflexes are intact.  She has pain on palpation and end range of motion of the second metatarsal phalangeal joint of the left foot.  This is consistent with capsulitis.  Assessment: Chronic capsulitis probable tear of the plantar plate left foot.  Plan: Discussed etiology pathology conservative versus surgical therapies.  At this point after sterile Betadine skin prep I injected around the second metatarsal phalangeal joint with 10 mg of Kenalog 5 mg of Marcaine.  She also saw Liliane Channel for pair of orthotics.  We are requesting the MRI still for surgical intervention.

## 2019-01-14 ENCOUNTER — Telehealth: Payer: Self-pay | Admitting: Podiatry

## 2019-01-14 NOTE — Telephone Encounter (Signed)
Patient called wanting to know if the doctor could write her a letter stating that a hot tub would be beneficial/therapeutic for her foot pain. Patient is looking to get a hot tub at her house and was told she could get the hot tub tax free if she had a doctors letter.   Please give the patient a call to let her know if this is something the doctor is willing to do.

## 2019-01-15 ENCOUNTER — Telehealth: Payer: Self-pay | Admitting: *Deleted

## 2019-01-15 NOTE — Telephone Encounter (Signed)
Patient called back and stated that she had called 2 days ago concerning a letter(letterhead) in regards to a Spa being therapeutic for her hammertoes,she would like it mailed to her address asap.

## 2019-01-18 NOTE — Telephone Encounter (Signed)
Returned patient call - advised Dr. Milinda Pointer could not write a letter for her to get a hot tub. This would not be medically necessary for her foot condition seeing they make a smaller item of the same she could get for her foot, example would be a foot spa.

## 2019-01-27 ENCOUNTER — Ambulatory Visit
Admission: RE | Admit: 2019-01-27 | Discharge: 2019-01-27 | Disposition: A | Discharge status: Home / Self Care | Payer: BC Managed Care – PPO | Source: Ambulatory Visit | Attending: Podiatry | Admitting: Podiatry

## 2019-01-27 ENCOUNTER — Other Ambulatory Visit: Payer: Self-pay

## 2019-01-29 ENCOUNTER — Telehealth: Payer: Self-pay | Admitting: *Deleted

## 2019-01-29 NOTE — Telephone Encounter (Signed)
-----   Message from Garrel Ridgel, [place] sent at 01/27/2019 12:52 PM EDT ----- Send for over read and inform patient of the delay.

## 2019-01-29 NOTE — Telephone Encounter (Signed)
Left message informing pt of Dr. Stephenie Acres request to send copy of MRI disc to a radiology specialist for more details for treatment planning and there would be a 10-14 day delay in final results, once received we would call with instruction. I informed pt I would cancel the 02/02/2019 appt with Dr. Milinda Pointer, but to keep the appt with R. Puckett to pick up orthotics, and call with questions.

## 2019-01-29 NOTE — Telephone Encounter (Signed)
Mailed copy of MRI disc to [facility]. 

## 2019-02-02 ENCOUNTER — Ambulatory Visit: Payer: BC Managed Care – PPO | Admitting: Orthotics

## 2019-02-02 ENCOUNTER — Ambulatory Visit: Payer: BC Managed Care – PPO | Admitting: Podiatry

## 2019-02-02 ENCOUNTER — Other Ambulatory Visit: Payer: Self-pay

## 2019-02-02 DIAGNOSIS — G5762 Lesion of plantar nerve, left lower limb: Secondary | ICD-10-CM

## 2019-02-02 DIAGNOSIS — M2041 Other hammer toe(s) (acquired), right foot: Secondary | ICD-10-CM

## 2019-02-02 NOTE — Progress Notes (Signed)
Patient came in today to p/up functional foot orthotics.   The orthotics were assessed to both fit and function.  The F/O addressed the biomechanical issues/pathologies as intended, offering good longitudinal arch support, proper offloading, and foot support. There weren't any signs of discomfort or irritation.  The F/O fit properly in footwear with minimal trimming/adjustments. 

## 2019-02-17 ENCOUNTER — Other Ambulatory Visit: Payer: Self-pay

## 2019-02-17 ENCOUNTER — Ambulatory Visit
Admission: RE | Admit: 2019-02-17 | Discharge: 2019-02-17 | Disposition: A | Discharge status: Home / Self Care | Payer: BC Managed Care – PPO | Source: Ambulatory Visit | Attending: Family Medicine | Admitting: Family Medicine

## 2019-02-17 ENCOUNTER — Other Ambulatory Visit: Payer: Self-pay | Admitting: Family Medicine

## 2019-02-17 DIAGNOSIS — M542 Cervicalgia: Secondary | ICD-10-CM

## 2019-02-18 ENCOUNTER — Ambulatory Visit: Payer: BC Managed Care – PPO | Admitting: Orthotics

## 2019-02-18 ENCOUNTER — Ambulatory Visit (INDEPENDENT_AMBULATORY_CARE_PROVIDER_SITE_OTHER): Payer: BC Managed Care – PPO | Admitting: Podiatry

## 2019-02-18 DIAGNOSIS — M779 Enthesopathy, unspecified: Secondary | ICD-10-CM | POA: Diagnosis not present

## 2019-02-18 DIAGNOSIS — G5762 Lesion of plantar nerve, left lower limb: Secondary | ICD-10-CM

## 2019-02-18 DIAGNOSIS — M778 Other enthesopathies, not elsewhere classified: Secondary | ICD-10-CM

## 2019-02-18 DIAGNOSIS — M2041 Other hammer toe(s) (acquired), right foot: Secondary | ICD-10-CM

## 2019-02-18 NOTE — Progress Notes (Signed)
Patient not happy w f/o; said made feet hurt worse; told her I would modify, but she doesn't want the financial responsibility of the f/o.  I told her she would have to talk to [place] about that.

## 2019-02-19 ENCOUNTER — Encounter: Payer: Self-pay | Admitting: Podiatry

## 2019-02-20 ENCOUNTER — Encounter: Payer: Self-pay | Admitting: Podiatry

## 2019-02-20 NOTE — Progress Notes (Signed)
She presents today for follow-up of her MRI results and to discuss possible surgical intervention.  She states that she has purchased new shoes and states that the medication seems to be helping.  Objective: Vital signs are stable she is alert and oriented x3.  Pulses are palpable.  She still has some tenderness on palpation of the second metatarsal phalangeal joint with hammertoe deformity present.  MRI does demonstrate just a simple dorsal dislocation of the toe at the level of the metatarsal phalangeal joint however there does not appear to be a tear of the plantar plate only thickening.  Assessment: Hammertoe deformity with dislocation of the toe.  Plan: Discussed etiology pathology conservative versus surgical therapies.  At this point she would like to keep her surgery on the books for October or November however if she feels that she is improving she will call and reschedule the surgery or cancel it.

## 2019-03-18 ENCOUNTER — Telehealth: Payer: Self-pay | Admitting: *Deleted

## 2019-03-18 NOTE — Telephone Encounter (Signed)
I attempted to call the patient regarding her appointment that we had scheduled for 04/16/2019.  I left her a message that unfortunately April 16, 2019 is not available.  I informed her that we can do either the week before on April 09, 2019 or the following week on 11/13/ 2020.  "I am returning your call.  You can cancel out that appointment.  I do have a surgery consult with the doctor on 10/15.  I think I'm going to cancel out and not reschedule anything.  If you have any questions, give me a call back."  I am returning your call.  I know you want to cancel the surgery but do you want to cancel the consult appointment as well?  "Yes, I do.  He told me to try the medication and see how it would do for me.  My foot feels better and the swelling has gone down.  So I think I'm going to hold off for now.  If I have any more problems, I'll give you all a call."  I'll cancel the surgery and consult appointments.  I called Caren Griffins at [facility] and canceled the surgery for 04/16/2019 and I canceled her consult appointment for 03/25/2019.

## 2019-03-25 ENCOUNTER — Ambulatory Visit: Payer: BLUE CROSS/BLUE SHIELD | Admitting: Podiatry

## 2019-04-10 ENCOUNTER — Other Ambulatory Visit: Payer: Self-pay | Admitting: Podiatry

## 2019-04-26 ENCOUNTER — Telehealth: Payer: Self-pay | Admitting: Podiatry

## 2019-04-26 NOTE — Telephone Encounter (Signed)
I'm scheduled for my surgery consult for 12/08. I'd like to go ahead and get a date scheduled for surgery. I told the pt I could get her tentatively scheduled for surgery on Thursday 12/17 and when she comes in for her consult appointment that can confirm that date with her. I told the pt she would get her boot when she comes in to go over and sign her consent forms. I went ahead and told the pt I could not give her a time, that someone would call her a day or two prior from the surgical center to let her know what time to arrive.

## 2019-05-18 ENCOUNTER — Ambulatory Visit: Payer: BC Managed Care – PPO | Admitting: Podiatry

## 2019-05-20 ENCOUNTER — Other Ambulatory Visit: Payer: Self-pay

## 2019-05-20 ENCOUNTER — Encounter: Payer: Self-pay | Admitting: Podiatry

## 2019-05-20 ENCOUNTER — Telehealth: Payer: Self-pay | Admitting: Podiatry

## 2019-05-20 ENCOUNTER — Ambulatory Visit (INDEPENDENT_AMBULATORY_CARE_PROVIDER_SITE_OTHER): Payer: BC Managed Care – PPO | Admitting: Podiatry

## 2019-05-20 DIAGNOSIS — M216X2 Other acquired deformities of left foot: Secondary | ICD-10-CM

## 2019-05-20 DIAGNOSIS — M2042 Other hammer toe(s) (acquired), left foot: Secondary | ICD-10-CM

## 2019-05-20 NOTE — Progress Notes (Signed)
She presents today for surgical consult regarding her second metatarsophalangeal joint and hammertoe deformity left foot.  States that I really got tired of dealing with it shoes and medications do not seem to be working anymore.  Seems to be getting worse and worse ready to have something surgically corrected.  ROS: Denies fever chills nausea vomiting muscle aches pains calf pain back pain chest pain shortness of breath and headache.  Medication she is no longer taking the meloxicam and the phentermine  No known drug allergies  Objective: Vital signs are stable she is alert and oriented x3 hallux interphalangeal with mild hallux valgus left cocked up hammertoe deformity with lateral deviation second metatarsophalangeal joint left.  Mild edema no erythema cellulitis drainage or odor MRI was reviewed indicating thickening of the capsule and dislocation of the toe.  Assessment: Hammertoe deformity plantarflexed second metatarsal left.  Plan: Discussed etiology pathology conservative surgical therapies at this point time consented her for second metatarsal osteotomy and hammertoe repair with screw and or pin second left she understands this is amenable to it we did discuss the possible postop complications which may include but not limited to postop pain bleeding swelling infection recurrence need for further surgery overcorrection under correction also digit loss of limb loss of life.  Dispensed information regarding the surgery center anesthesia as well as instructions for the morning of surgery.  We will follow-up with her next week for surgery.

## 2019-05-20 NOTE — Patient Instructions (Signed)
Pre-Operative Instructions  Congratulations, you have decided to take an important step towards improving your quality of life.  You can be assured that the doctors and staff at [facility] will be with you every step of the way.  Here are some important things you should know:  1. Plan to be at the surgery center/hospital at least 1 (one) hour prior to your scheduled time, unless otherwise directed by the surgical center/hospital staff.  You must have a responsible adult accompany you, remain during the surgery and drive you home.  Make sure you have directions to the surgical center/hospital to ensure you arrive on time. 2. If you are having surgery at [facility] or [facility] hospitals, you will need a copy of your medical history and physical form from your family physician within one month prior to the date of surgery. We will give you a form for your primary physician to complete.  3. We make every effort to accommodate the date you request for surgery.  However, there are times where surgery dates or times have to be moved.  We will contact you as soon as possible if a change in schedule is required.   4. No aspirin/ibuprofen for one week before surgery.  If you are on aspirin, any non-steroidal anti-inflammatory medications (Mobic, Aleve, Ibuprofen) should not be taken seven (7) days prior to your surgery.  You make take Tylenol for pain prior to surgery.  5. Medications - If you are taking daily heart and blood pressure medications, seizure, reflux, allergy, asthma, anxiety, pain or diabetes medications, make sure you notify the surgery center/hospital before the day of surgery so they can tell you which medications you should take or avoid the day of surgery. 6. No food or drink after midnight the night before surgery unless directed otherwise by surgical center/hospital staff. 7. No alcoholic beverages 24-hours prior to surgery.  No smoking 24-hours prior or 24-hours after  surgery. 8. Wear loose pants or shorts. They should be loose enough to fit over bandages, boots, and casts. 9. Don't wear slip-on shoes. Sneakers are preferred. 10. Bring your boot with you to the surgery center/hospital.  Also bring crutches or a walker if your physician has prescribed it for you.  If you do not have this equipment, it will be provided for you after surgery. 11. If you have not been contacted by the surgery center/hospital by the day before your surgery, call to confirm the date and time of your surgery. 12. Leave-time from work may vary depending on the type of surgery you have.  Appropriate arrangements should be made prior to surgery with your employer. 13. Prescriptions will be provided immediately following surgery by your doctor.  Fill these as soon as possible after surgery and take the medication as directed. Pain medications will not be refilled on weekends and must be approved by the doctor. 14. Remove nail polish on the operative foot and avoid getting pedicures prior to surgery. 15. Wash the night before surgery.  The night before surgery wash the foot and leg well with water and the antibacterial soap provided. Be sure to pay special attention to beneath the toenails and in between the toes.  Wash for at least three (3) minutes. Rinse thoroughly with water and dry well with a towel.  Perform this wash unless told not to do so by your physician.  Enclosed: 1 Ice pack (please put in freezer the night before surgery)   1 Hibiclens skin cleaner     Pre-op instructions  If you have any questions regarding the instructions, please do not hesitate to call our office.  [place]: [address], [place], [place] [address] -- [phone]  [place]: [address] [place], [place] [address] -- [phone]  [place]: [address], [place], [place] [address] -- [phone]   Website: [email or web address] 

## 2019-05-20 NOTE — Telephone Encounter (Signed)
DOS: 05/27/2019  SURGICAL PROCEDURES: Metatarsal Osteotomy w/ screw fixation 2nd SVXB(93903) and Hammertoe Repair w/ screw/pin fixation 2nd ESPQ(33007)  BCBS Policy Effective : 62/26/3335  -  06/09/9998  Member Liability Summary       In-Network   Max Per Benefit Period Year-to-Date Remaining     CoInsurance       Deductible $6,000.00                      $0      Out-Of-Pocket $13,100.00        $6,503.04  Hospital - Ambulatory Surgical      In-Network Copay Coinsurance Authorization Required Not Applicable 0%  No Messages: [place] of Service:  [place] Authorization Required $0  Not Applicable No Messages: [place] of Service:  [place]

## 2019-05-26 ENCOUNTER — Other Ambulatory Visit: Payer: Self-pay | Admitting: Podiatry

## 2019-05-26 MED ORDER — CEPHALEXIN 500 MG PO CAPS: 500.0000 mg | ORAL_CAPSULE | Freq: Three times a day (TID) | ORAL | 0 refills | Status: DC

## 2019-05-26 MED ORDER — ONDANSETRON HCL 4 MG PO TABS: 4.0000 mg | ORAL_TABLET | Freq: Three times a day (TID) | ORAL | 0 refills | Status: DC | PRN

## 2019-05-26 MED ORDER — OXYCODONE-ACETAMINOPHEN 10-325 MG PO TABS: 1.0000 | ORAL_TABLET | Freq: Three times a day (TID) | ORAL | 0 refills | Status: AC | PRN

## 2019-05-27 ENCOUNTER — Encounter: Payer: Self-pay | Admitting: Podiatry

## 2019-05-27 DIAGNOSIS — M2042 Other hammer toe(s) (acquired), left foot: Secondary | ICD-10-CM | POA: Diagnosis not present

## 2019-05-27 DIAGNOSIS — M21542 Acquired clubfoot, left foot: Secondary | ICD-10-CM | POA: Diagnosis not present

## 2019-06-02 ENCOUNTER — Ambulatory Visit (INDEPENDENT_AMBULATORY_CARE_PROVIDER_SITE_OTHER): Payer: BC Managed Care – PPO

## 2019-06-02 ENCOUNTER — Ambulatory Visit (INDEPENDENT_AMBULATORY_CARE_PROVIDER_SITE_OTHER): Payer: BC Managed Care – PPO | Admitting: Podiatry

## 2019-06-02 ENCOUNTER — Other Ambulatory Visit: Payer: Self-pay

## 2019-06-02 DIAGNOSIS — M216X2 Other acquired deformities of left foot: Secondary | ICD-10-CM

## 2019-06-02 DIAGNOSIS — Z9889 Other specified postprocedural states: Secondary | ICD-10-CM

## 2019-06-02 DIAGNOSIS — M2042 Other hammer toe(s) (acquired), left foot: Secondary | ICD-10-CM

## 2019-06-05 ENCOUNTER — Encounter: Payer: Self-pay | Admitting: Podiatry

## 2019-06-05 NOTE — Progress Notes (Signed)
  Subjective:  Patient ID: Shannon Johnston, female    DOB: 26-May-1966,  MRN: [id number]  Chief Complaint  Patient presents with  . Routine Post Op    pov#1 dos 12.17.2020 Metatarsal Osteotomy 2nd Lt, Hammertoe Repair 2nd Lt, pt states that the right foot second toe is doing a lot better since the last time she was here, pt shows no signs of infection, pt also has no other comments or concerns    53 y.o. female returns for post-op check.  She states that she is doing well.  She denies any other acute complaints.  She states her pain is well controlled.  She has been ambulating in the cam boot.  She has no clinical signs of infection.  Her bandage has been clean dry and intact.  She states her pain is 3 out of 10.  She denies any other acute complaints.  Review of Systems: Negative except as noted in the HPI. Denies N/V/F/Ch.  Past Medical History:  Diagnosis Date  . Arthritis    right hand, no meds    Current Outpatient Medications:  .  cephALEXin (KEFLEX) 500 MG capsule, Take 1 capsule (500 mg total) by mouth 3 (three) times daily., Disp: 30 capsule, Rfl: 0 .  ibuprofen (ADVIL,MOTRIN) 400 MG tablet, Take 400 mg by mouth every 6 (six) hours as needed for mild pain., Disp: , Rfl:  .  meloxicam (MOBIC) 15 MG tablet, TAKE 1 TABLET BY MOUTH EVERY DAY, Disp: 30 tablet, Rfl: 3 .  ondansetron (ZOFRAN) 4 MG tablet, Take 1 tablet (4 mg total) by mouth every 8 (eight) hours as needed., Disp: 20 tablet, Rfl: 0 .  phentermine 37.5 MG capsule, Take 37.5 mg by mouth every morning., Disp: , Rfl:  .  traMADol (ULTRAM) 50 MG tablet, Take 1 tablet (50 mg total) by mouth every 6 (six) hours as needed., Disp: 20 tablet, Rfl: 0  Social History   Tobacco Use  Smoking Status Never Smoker  Smokeless Tobacco Never Used    No Known Allergies Objective:  There were no vitals filed for this visit. There is no height or weight on file to calculate BMI. Constitutional Well developed. Well nourished.    Vascular Foot warm and well perfused. Capillary refill normal to all digits.   Neurologic Normal speech. Oriented to person, place, and time. Epicritic sensation to light touch grossly present bilaterally.  Dermatologic Skin healing well without signs of infection. Skin edges well coapted without signs of infection.  Orthopedic: Tenderness to palpation noted about the surgical site.   Radiographs: 2 views of skeletally mature adult foot: Hardware is intact without any signs of loosening or backing out.  Good correction and alignment noted. Assessment:   1. Plantar flexed metatarsal, left   2. Hammer toe of left foot   3. Status post foot surgery    Plan:  Patient was evaluated and treated and all questions answered.  S/p foot surgery left -Progressing as expected post-operatively. -XR: See above -WB Status: Weightbearing as tolerated in cam boot -Sutures: Incision is intact without any signs of dehiscence.  No clinical signs of infection noted.. -Medications: None -Foot redressed.  No follow-ups on file.

## 2019-06-15 ENCOUNTER — Other Ambulatory Visit: Payer: Self-pay

## 2019-06-15 ENCOUNTER — Ambulatory Visit (INDEPENDENT_AMBULATORY_CARE_PROVIDER_SITE_OTHER): Payer: BC Managed Care – PPO | Admitting: Podiatry

## 2019-06-15 DIAGNOSIS — M216X2 Other acquired deformities of left foot: Secondary | ICD-10-CM

## 2019-06-15 DIAGNOSIS — M2042 Other hammer toe(s) (acquired), left foot: Secondary | ICD-10-CM

## 2019-06-15 DIAGNOSIS — Z9889 Other specified postprocedural states: Secondary | ICD-10-CM

## 2019-06-16 NOTE — Progress Notes (Signed)
She presents today for second postop visit date of surgery is 05/27/2019 status post hammertoe repair and second metatarsal osteotomy of the left foot.  States that the pin is extruded a little bit but I was up on it a lot yesterday walking trying to take Christmas decorations down.  Objective: Vital signs are stable she is alert and oriented x3.  Pulses are palpable.  She still has some swelling to the foot but the incision site appears to be healing well margins are well coapted sutures were removed today margins remain coapted.  Her K wire is extruded slightly by about a centimeter.  Assessment: Well-healing surgical toe and foot.  Plan: We dispensed a compression anklet demonstrated to her how to cover the K wire so as not to have it pulled.  Also placed her in a [place] shoe.  Would like to follow-up with her in about 3 weeks at which time we can pull the K wire.

## 2019-06-19 ENCOUNTER — Ambulatory Visit (INDEPENDENT_AMBULATORY_CARE_PROVIDER_SITE_OTHER): Payer: BC Managed Care – PPO | Admitting: Podiatry

## 2019-06-19 ENCOUNTER — Ambulatory Visit (INDEPENDENT_AMBULATORY_CARE_PROVIDER_SITE_OTHER): Payer: BC Managed Care – PPO

## 2019-06-19 ENCOUNTER — Encounter: Payer: Self-pay | Admitting: Podiatry

## 2019-06-19 ENCOUNTER — Other Ambulatory Visit: Payer: Self-pay

## 2019-06-19 DIAGNOSIS — M2042 Other hammer toe(s) (acquired), left foot: Secondary | ICD-10-CM | POA: Diagnosis not present

## 2019-06-19 NOTE — Progress Notes (Signed)
Subjective: 54 year old female presents the office today for concerns of her K wire coming loose.  She states that she was wearing the compression sock and she felt the K wire coming out yesterday.  She denies any recent injury.  No increased pain.  No increase in swelling.  She recently underwent surgery with Dr. Al Corpus on May 27, 2019.  Denies any systemic complaints such as fevers, chills, nausea, vomiting. No acute changes since last appointment, and no other complaints at this time.   Objective: AAO x3, NAD DP/PT pulses palpable bilaterally, CRT less than 3 seconds Incision is healing well.  The K wire has come out quite a bit.  Minimal swelling to the toe.  There is no erythema or warmth there is no clinical signs of infection. No pain with calf compression, swelling, warmth, erythema  Assessment: Loosening K wire right  Plan: -All treatment options discussed with the patient including all alternatives, risks, complications.  -X-rays obtained reviewed.  The K wire has come loose and is still in the second toe to the level of the base of the proximal phalanx but is no longer across the MPJ. -The toe was in rectus position and there is no increase in pain or swelling.  I did rebend and cut the K wire.  Dressing applied and discussed leave the dressing intact.  Continue with surgical shoe ice elevation.  Follow with Dr. Al Corpus as scheduled. -Patient encouraged to call the office with any questions, concerns, change in symptoms.   Shannon Johnston DPM

## 2019-06-22 ENCOUNTER — Ambulatory Visit: Payer: BC Managed Care – PPO | Admitting: Podiatry

## 2019-06-29 ENCOUNTER — Encounter: Payer: BC Managed Care – PPO | Admitting: Podiatry

## 2019-07-06 ENCOUNTER — Other Ambulatory Visit: Payer: Self-pay

## 2019-07-06 ENCOUNTER — Ambulatory Visit (INDEPENDENT_AMBULATORY_CARE_PROVIDER_SITE_OTHER): Payer: BC Managed Care – PPO | Admitting: Podiatry

## 2019-07-06 ENCOUNTER — Encounter: Payer: Self-pay | Admitting: Podiatry

## 2019-07-06 ENCOUNTER — Ambulatory Visit (INDEPENDENT_AMBULATORY_CARE_PROVIDER_SITE_OTHER): Payer: BC Managed Care – PPO

## 2019-07-06 DIAGNOSIS — M216X2 Other acquired deformities of left foot: Secondary | ICD-10-CM | POA: Diagnosis not present

## 2019-07-06 DIAGNOSIS — M2042 Other hammer toe(s) (acquired), left foot: Secondary | ICD-10-CM | POA: Diagnosis not present

## 2019-07-06 DIAGNOSIS — Z9889 Other specified postprocedural states: Secondary | ICD-10-CM

## 2019-07-06 NOTE — Progress Notes (Signed)
She presents today postop visit date of surgery 05/27/2019.  She states that she is doing very well no complications.  She has a's hammertoe repair second left and a second metatarsal osteotomy left.  Objective: Understands alert and oriented x3 minimal edema no erythema cellulitis drainage or odor K wire still retained to the second toe.  Radiographs confirm today toe is in good position K wire is intact went ahead and remove the K wire today toe remains in good position.  Assessment: Well-healing surgical foot.  Plan: I will allow her back into regular shoes in the next 2 weeks encouraged her to massage the soft tissue surrounding the joint to help break up the scar tissue.  Also encouraged range of motion exercises and I will follow-up with her in 1 month.

## 2019-07-13 ENCOUNTER — Encounter: Payer: BC Managed Care – PPO | Admitting: Podiatry

## 2019-08-03 ENCOUNTER — Encounter: Payer: Self-pay | Admitting: Podiatry

## 2019-08-03 ENCOUNTER — Other Ambulatory Visit: Payer: Self-pay

## 2019-08-03 ENCOUNTER — Ambulatory Visit (INDEPENDENT_AMBULATORY_CARE_PROVIDER_SITE_OTHER): Payer: BC Managed Care – PPO | Admitting: Podiatry

## 2019-08-03 ENCOUNTER — Ambulatory Visit (INDEPENDENT_AMBULATORY_CARE_PROVIDER_SITE_OTHER): Payer: BC Managed Care – PPO

## 2019-08-03 DIAGNOSIS — M216X2 Other acquired deformities of left foot: Secondary | ICD-10-CM

## 2019-08-03 DIAGNOSIS — M2042 Other hammer toe(s) (acquired), left foot: Secondary | ICD-10-CM

## 2019-08-03 DIAGNOSIS — Z9889 Other specified postprocedural states: Secondary | ICD-10-CM

## 2019-08-03 NOTE — Progress Notes (Signed)
She presents today for follow-up of her surgical foot left.  She denies fever chills nausea vomiting muscle aches or pain states that she still gets a little sharp shooting pain through the toe at the level of the PIP joint occasionally.  She states that she is back to walking 2 miles a day with no pain.  States that she needs to purchase a new pair of tennis shoes.  Objective: Vital signs stable alert and oriented x3.  Pulses are palpable.  Incision site is going on to heal very nicely.  She has very little in the way of elevation of the second toe.  She has good range of motion at the second metatarsophalangeal joint status post second metatarsal osteotomy with double screw fixation and hammertoe repair.  Assessment: Well-healing surgical foot left.  Plan: Encourage range of motion exercises encouraged her to purchase a new pair of shoes will follow up with me in 1 month if necessary otherwise follow-up with me when she wants to do the contralateral foot.

## 2019-09-02 ENCOUNTER — Ambulatory Visit (INDEPENDENT_AMBULATORY_CARE_PROVIDER_SITE_OTHER): Payer: BC Managed Care – PPO | Admitting: Podiatry

## 2019-09-02 ENCOUNTER — Other Ambulatory Visit: Payer: Self-pay

## 2019-09-02 ENCOUNTER — Ambulatory Visit: Payer: BC Managed Care – PPO

## 2019-09-02 DIAGNOSIS — M2042 Other hammer toe(s) (acquired), left foot: Secondary | ICD-10-CM

## 2019-09-02 DIAGNOSIS — M216X2 Other acquired deformities of left foot: Secondary | ICD-10-CM

## 2019-09-02 DIAGNOSIS — Z9889 Other specified postprocedural states: Secondary | ICD-10-CM

## 2019-09-02 NOTE — Progress Notes (Signed)
She presents today date of surgery May 27, 2019 status post Lap[place]us procedure second metatarsal osteotomy hammertoe repair.  She states that I feel like I am starting to get a spot over here she refers to the third metatarsal left foot.  Objective: Vital signs stable alert oriented x3 she has great range of motion of the first metatarsophalangeal joint of the second toe she does have an area of prominence beneath the third metatarsal phalangeal joint now much like she did beneath the second however there is no toe change at this point in time is moderately tender on palpation and warm to the touch.  Assessment: Most likely capsulitis or plantarflexed third metatarsal of the left foot.  Could be due to compensation from the first and second metatarsals.  Plan: Discussed etiology pathology and surgical therapies offered injection she declined at this point she wants to continue to use stiff soled shoes and the meloxicam that she has at home 15 mg 1 daily.  I will follow-up with her in about 3 weeks if necessary but she did not make an appointment.

## 2021-08-28 IMAGING — CR DG CERVICAL SPINE COMPLETE 4+V
5 series · 5 of 5 positions shown · non-contrast
Comparison: None.

CLINICAL DATA: Cervicalgia

EXAM:
CERVICAL SPINE - COMPLETE 4+ VIEW

[w cervical spine lat]
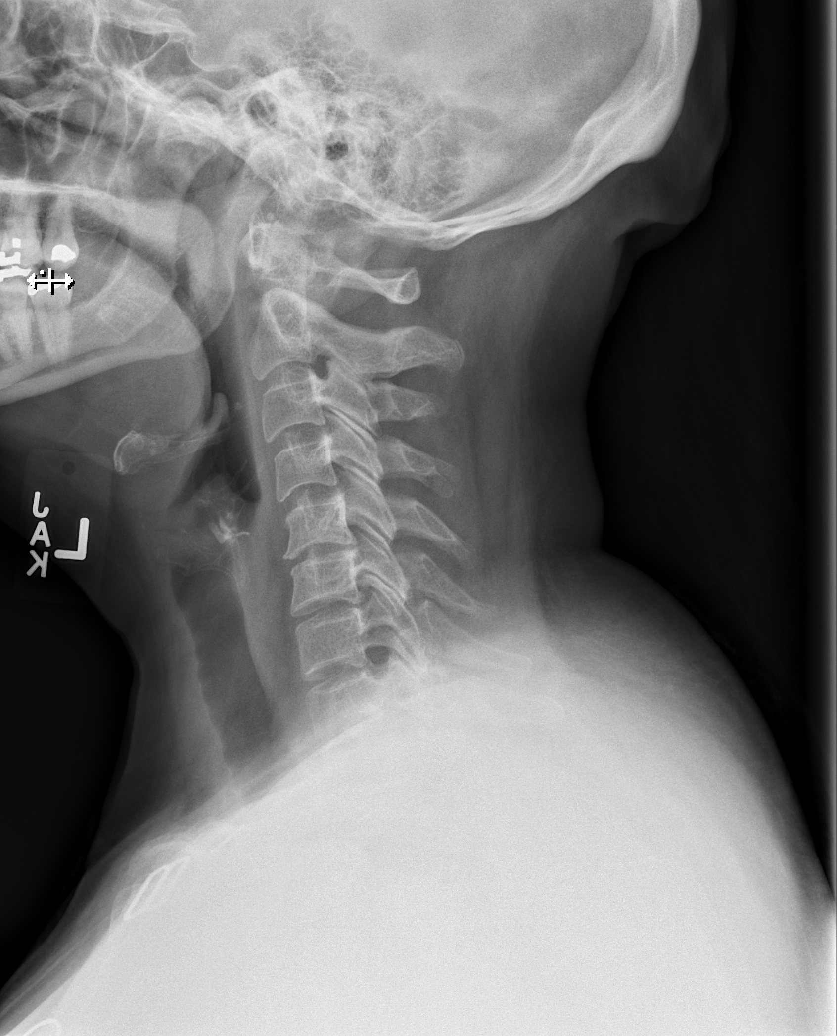

[w cervical spine ap_obl (1 of 2)]
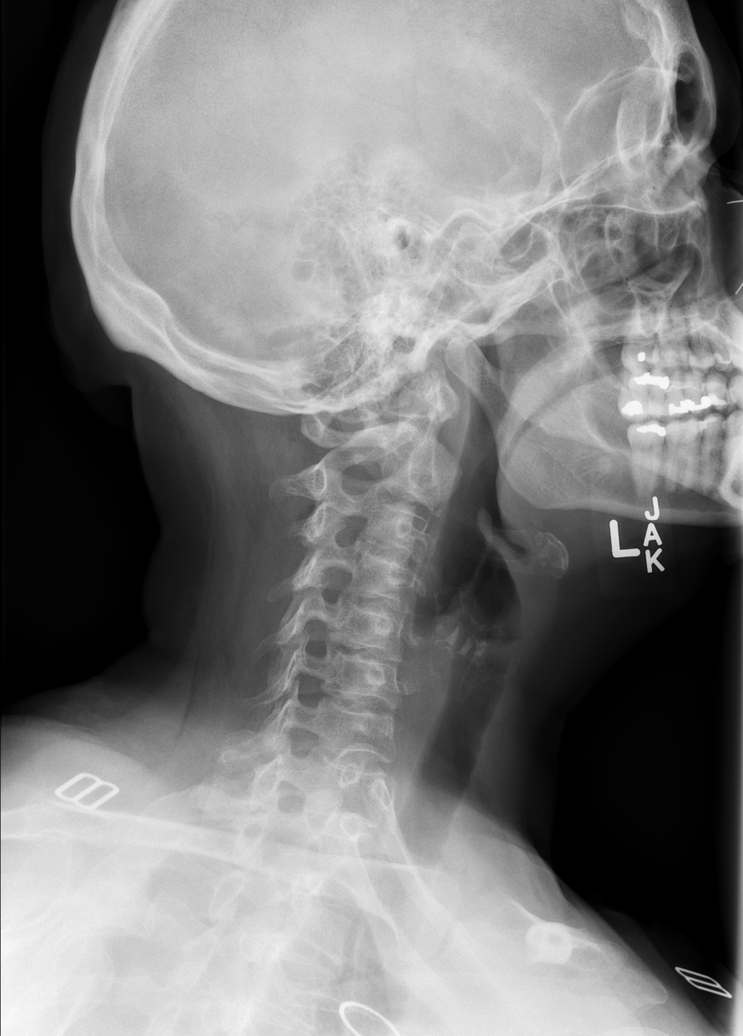

[w cervical spine ap_obl (2 of 2)]
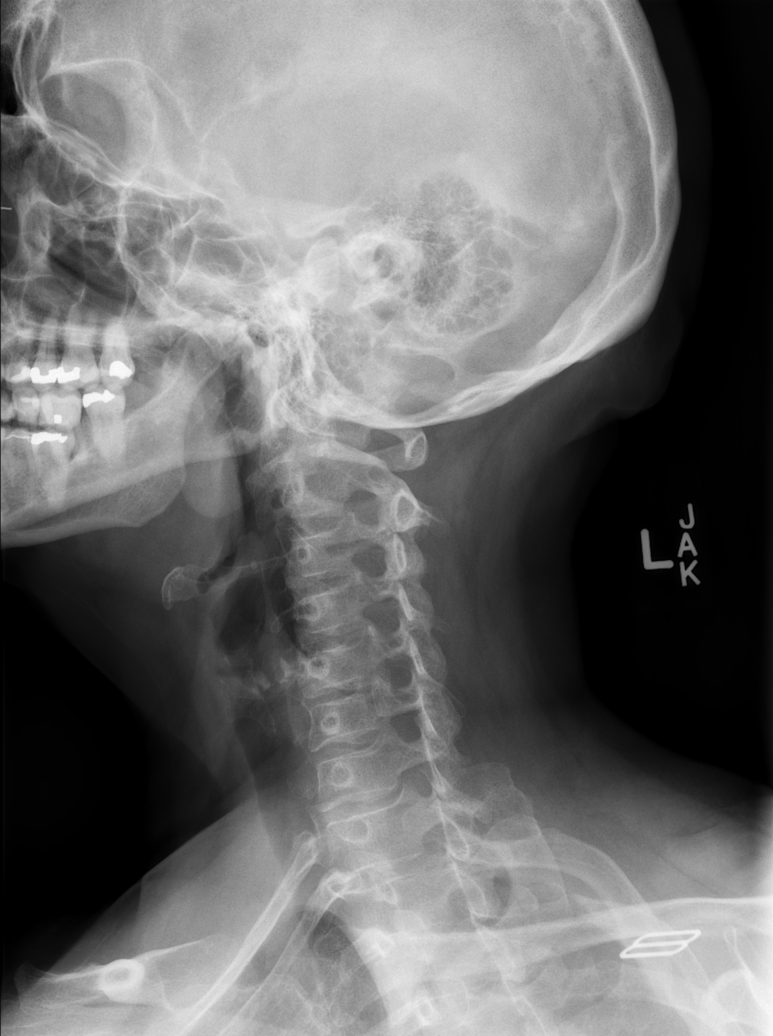

[w cervical spine ap (1 of 2)]
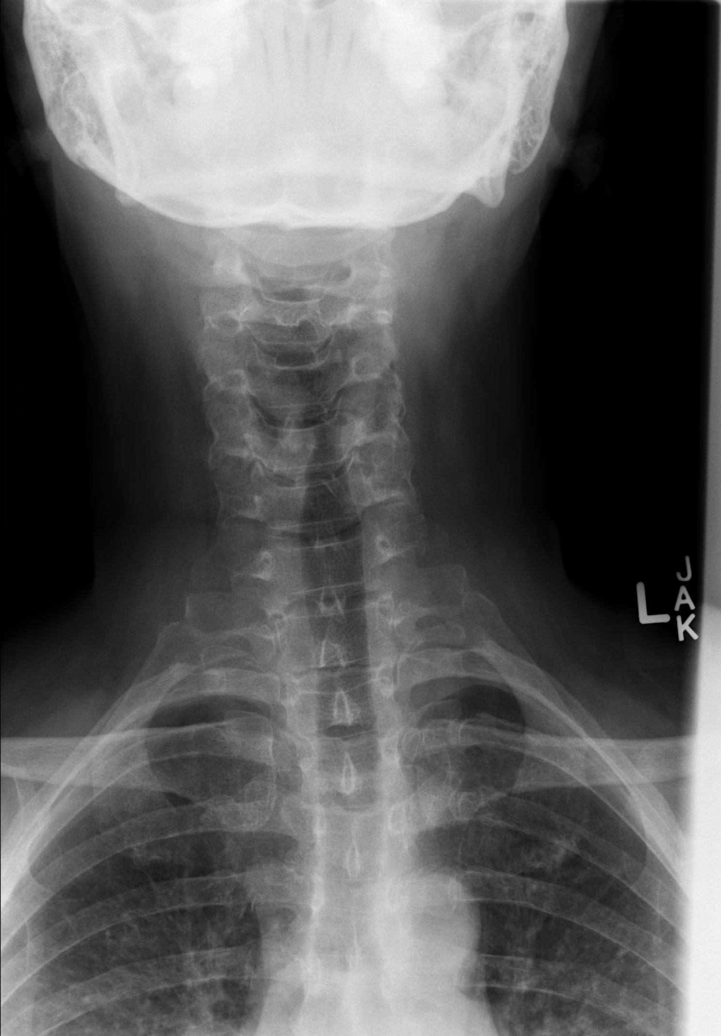

[w cervical spine ap (2 of 2)]
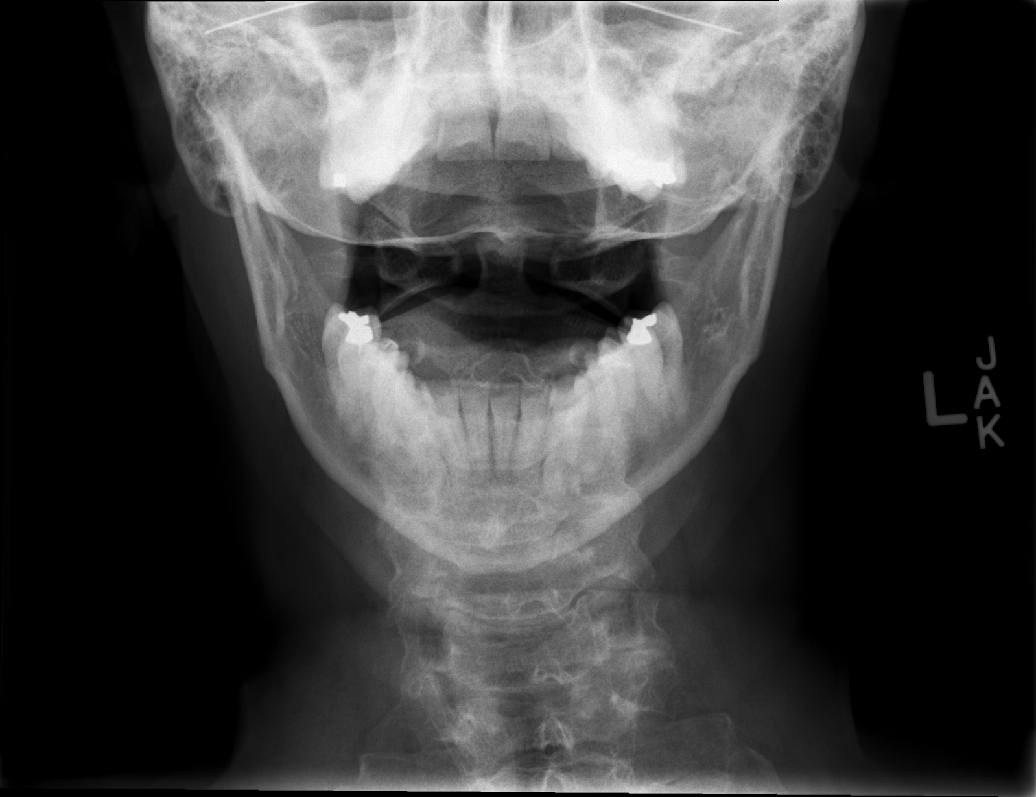

[5 of 5 positions shown; findings below may reference images not displayed]

FINDINGS: Frontal, lateral, open-mouth odontoid, and bilateral oblique views
were obtained. There is no evident fracture or spondylolisthesis.
Prevertebral soft tissues and predental space regions are normal.
There is moderate disc space narrowing at C5-6, C6-7, and C7-T1.
There is facet hypertrophy with exit foraminal narrowing at C3-4,
C4-5, C5-6, and C6-7 bilaterally, most notably at C5-6 bilaterally.
No erosive changes.

There is reversal of lordotic curvature.  Lung apices are clear.
IMPRESSION: Osteoarthritic change at several levels, slightly more severe at
C5-6 than elsewhere. No fracture or spondylolisthesis.

Reversal of lordotic curvature likely is indicative of muscle spasm.

## 2022-05-31 ENCOUNTER — Other Ambulatory Visit: Payer: Self-pay | Admitting: Obstetrics and Gynecology

## 2022-05-31 DIAGNOSIS — N6310 Unspecified lump in the right breast, unspecified quadrant: Secondary | ICD-10-CM

## 2022-08-13 ENCOUNTER — Ambulatory Visit
Admission: RE | Admit: 2022-08-13 | Discharge: 2022-08-13 | Disposition: A | Discharge status: Home / Self Care | Payer: Self-pay | Source: Ambulatory Visit | Attending: Obstetrics and Gynecology | Admitting: Obstetrics and Gynecology

## 2022-08-13 ENCOUNTER — Other Ambulatory Visit: Payer: Self-pay | Admitting: Obstetrics and Gynecology

## 2022-08-13 ENCOUNTER — Ambulatory Visit
Admission: RE | Admit: 2022-08-13 | Discharge: 2022-08-13 | Disposition: A | Discharge status: Home / Self Care | Payer: BC Managed Care – PPO | Source: Ambulatory Visit | Attending: Obstetrics and Gynecology | Admitting: Obstetrics and Gynecology

## 2022-08-13 DIAGNOSIS — N6310 Unspecified lump in the right breast, unspecified quadrant: Secondary | ICD-10-CM
